# Patient Record
Sex: Female | Born: 1985 | Hispanic: No | Marital: Single | Smoking: Current some day smoker
Health system: Southern US, Community
[De-identification: ages and names within clinical notes are randomized; demographics above are authoritative.]

## PROBLEM LIST (undated history)

## (undated) DIAGNOSIS — F32A Depression, unspecified: Secondary | ICD-10-CM

## (undated) DIAGNOSIS — F419 Anxiety disorder, unspecified: Secondary | ICD-10-CM

## (undated) DIAGNOSIS — T7840XA Allergy, unspecified, initial encounter: Secondary | ICD-10-CM

## (undated) DIAGNOSIS — F329 Major depressive disorder, single episode, unspecified: Secondary | ICD-10-CM

## (undated) HISTORY — DX: Major depressive disorder, single episode, unspecified: F32.9

## (undated) HISTORY — PX: WISDOM TOOTH EXTRACTION: SHX21

## (undated) HISTORY — DX: Anxiety disorder, unspecified: F41.9

## (undated) HISTORY — DX: Depression, unspecified: F32.A

## (undated) HISTORY — DX: Allergy, unspecified, initial encounter: T78.40XA

---

## 2004-08-04 ENCOUNTER — Other Ambulatory Visit: Admission: RE | Admit: 2004-08-04 | Discharge: 2004-08-04 | Payer: Self-pay | Admitting: Family Medicine

## 2005-08-14 ENCOUNTER — Other Ambulatory Visit: Admission: RE | Admit: 2005-08-14 | Discharge: 2005-08-14 | Payer: Self-pay | Admitting: Obstetrics and Gynecology

## 2006-03-19 ENCOUNTER — Ambulatory Visit: Payer: Self-pay | Admitting: Family Medicine

## 2007-07-15 ENCOUNTER — Emergency Department (HOSPITAL_COMMUNITY): Admission: EM | Admit: 2007-07-15 | Discharge: 2007-07-15 | Payer: Self-pay | Admitting: Emergency Medicine

## 2007-10-19 ENCOUNTER — Encounter: Admission: RE | Admit: 2007-10-19 | Discharge: 2007-10-19 | Payer: Self-pay | Admitting: Internal Medicine

## 2007-10-31 ENCOUNTER — Other Ambulatory Visit: Admission: RE | Admit: 2007-10-31 | Discharge: 2007-10-31 | Payer: Self-pay | Admitting: Family Medicine

## 2007-11-01 ENCOUNTER — Ambulatory Visit: Payer: Self-pay | Admitting: Family Medicine

## 2008-01-20 ENCOUNTER — Ambulatory Visit: Payer: Self-pay | Admitting: Family Medicine

## 2008-06-08 ENCOUNTER — Ambulatory Visit: Payer: Self-pay | Admitting: Family Medicine

## 2008-10-18 ENCOUNTER — Other Ambulatory Visit: Admission: RE | Admit: 2008-10-18 | Discharge: 2008-10-18 | Payer: Self-pay | Admitting: Family Medicine

## 2008-10-18 ENCOUNTER — Ambulatory Visit: Payer: Self-pay | Admitting: Family Medicine

## 2008-12-27 ENCOUNTER — Ambulatory Visit: Payer: Self-pay | Admitting: Family Medicine

## 2008-12-31 ENCOUNTER — Ambulatory Visit: Payer: Self-pay | Admitting: Family Medicine
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
## 2009-02-13 ENCOUNTER — Ambulatory Visit: Payer: Self-pay | Admitting: Family Medicine

## 2009-02-18 ENCOUNTER — Ambulatory Visit: Payer: Self-pay | Admitting: Family Medicine

## 2009-09-24 ENCOUNTER — Ambulatory Visit: Payer: Self-pay | Admitting: Family Medicine

## 2009-10-15 ENCOUNTER — Ambulatory Visit: Payer: Self-pay | Admitting: Physician Assistant

## 2014-02-10 ENCOUNTER — Ambulatory Visit (INDEPENDENT_AMBULATORY_CARE_PROVIDER_SITE_OTHER): Payer: BC Managed Care – PPO | Admitting: Emergency Medicine

## 2014-02-10 VITALS — BP 94/60 | HR 71 | Temp 98.9°F | Resp 16 | Ht 66.0 in | Wt 139.1 lb

## 2014-02-10 DIAGNOSIS — J020 Streptococcal pharyngitis: Secondary | ICD-10-CM

## 2014-02-10 MED ORDER — PENICILLIN V POTASSIUM 500 MG PO TABS: 500.0000 mg | ORAL_TABLET | Freq: Four times a day (QID) | ORAL | Status: DC

## 2014-02-10 NOTE — Progress Notes (Signed)
[facility] [address], [place] [address] [phone]  Date:  02/10/2014   Name:  Hannah PoliBianca Memon   DOB:  04/29/1986   MRN:  [id number]  PCP:  No PCP Per Patient    Chief Complaint: Sore Throat and Fever   History of Present Illness:  Hannah PoliBianca Schneck is a 28 y.o. very pleasant female patient who presents with the following:  Ill with sore throat and difficulty swallowing.  No wheezing or shortness of breath.  No nausea or vomiting.  Has cough productive green sputum.  No improvement with over the counter medications or other home remedies. Denies other complaint or health concern today.   There are no active problems to display for this patient.   Past Medical History  Diagnosis Date  . Allergy   . Anxiety   . Depression     Past Surgical History  Procedure Laterality Date  . Wisdom tooth extraction      History  Substance Use Topics  . Smoking status: Current Some Day Smoker  . Smokeless tobacco: Never Used  . Alcohol Use: Yes     Comment: 3 beers/week    Family History  Problem Relation Age of Onset  . Cancer Mother   . Cancer Father   . Cancer Maternal Grandmother   . Hyperlipidemia Maternal Grandmother   . Stroke Maternal Grandmother   . Cancer Maternal Grandfather   . Diabetes Paternal Grandmother     Allergies  Allergen Reactions  . Azithromycin Diarrhea    Medication list has been reviewed and updated.  No current outpatient prescriptions on file prior to visit.   No current facility-administered medications on file prior to visit.    Review of Systems:  As per HPI, otherwise negative.    Physical Examination: Filed Vitals:   02/10/14 1111  BP: 94/60  Pulse: 71  Temp: 98.9 F (37.2 C)  Resp: 16   Filed Vitals:   02/10/14 1111  Height: 5\' 6"  (1.676 m)  Weight: 139 lb 2 oz (63.107 kg)   Body mass index is 22.47 kg/(m^2). Ideal Body Weight: Weight in (lb) to have BMI = 25: 154.6  GEN: WDWN, NAD,  Non-toxic, A & O x 3 HEENT: Atraumatic, Normocephalic. Neck supple. No masses, No LAD. Ears and Nose: No external deformity. CV: RRR, No M/G/R. No JVD. No thrill. No extra heart sounds. PULM: CTA B, no wheezes, crackles, rhonchi. No retractions. No resp. distress. No accessory muscle use. ABD: S, NT, ND, +BS. No rebound. No HSM. EXTR: No c/c/e NEURO Normal gait.  PSYCH: Normally interactive. Conversant. Not depressed or anxious appearing.  Calm demeanor.    Assessment and Plan: Strep Pen v k  Signed,  Phillips OdorJeffery Tatijana Bierly, MD

## 2014-02-10 NOTE — Patient Instructions (Signed)
Strep Throat Strep throat is an infection of the throat caused by a bacteria named Streptococcus pyogenes. Your caregiver may call the infection streptococcal "tonsillitis" or "pharyngitis" depending on whether there are signs of inflammation in the tonsils or back of the throat. Strep throat is most common in children aged 29-15 years during the cold months of the year, but it can occur in people of any age during any season. This infection is spread from person to person (contagious) through coughing, sneezing, or other close contact. SYMPTOMS   Fever or chills.  Painful, swollen, red tonsils or throat.  Pain or difficulty when swallowing.  White or yellow spots on the tonsils or throat.  Swollen, tender lymph nodes or "glands" of the neck or under the jaw.  Red rash all over the body (rare). DIAGNOSIS  Many different infections can cause the same symptoms. A test must be done to confirm the diagnosis so the right treatment can be given. A "rapid strep test" can help your caregiver make the diagnosis in a few minutes. If this test is not available, a light swab of the infected area can be used for a throat culture test. If a throat culture test is done, results are usually available in a day or two. TREATMENT  Strep throat is treated with antibiotic medicine. HOME CARE INSTRUCTIONS   Gargle with 1 tsp of salt in 1 cup of warm water, 3-4 times per day or as needed for comfort.  Family members who also have a sore throat or fever should be tested for strep throat and treated with antibiotics if they have the strep infection.  Make sure everyone in your household washes their hands well.  Do not share food, drinking cups, or personal items that could cause the infection to spread to others.  You may need to eat a soft food diet until your sore throat gets better.  Drink enough water and fluids to keep your urine clear or pale yellow. This will help prevent dehydration.  Get plenty of  rest.  Stay home from school, daycare, or work until you have been on antibiotics for 24 hours.  Only take over-the-counter or prescription medicines for pain, discomfort, or fever as directed by your caregiver.  If antibiotics are prescribed, take them as directed. Finish them even if you start to feel better. SEEK MEDICAL CARE IF:   The glands in your neck continue to enlarge.  You develop a rash, cough, or earache.  You cough up green, yellow-brown, or bloody sputum.  You have pain or discomfort not controlled by medicines.  Your problems seem to be getting worse rather than better. SEEK IMMEDIATE MEDICAL CARE IF:   You develop any new symptoms such as vomiting, severe headache, stiff or painful neck, chest pain, shortness of breath, or trouble swallowing.  You develop severe throat pain, drooling, or changes in your voice.  You develop swelling of the neck, or the skin on the neck becomes red and tender.  You have a fever.  You develop signs of dehydration, such as fatigue, dry mouth, and decreased urination.  You become increasingly sleepy, or you cannot wake up completely. Document Released: 07/10/2000 Document Revised: 06/29/2012 Document Reviewed: 09/11/2010 ExitCare Patient Information 2015 [employer]. This information is not intended to replace advice given to you by your health care provider. Make sure you discuss any questions you have with your health care provider.  

## 2014-05-16 ENCOUNTER — Ambulatory Visit (INDEPENDENT_AMBULATORY_CARE_PROVIDER_SITE_OTHER): Payer: BC Managed Care – PPO | Admitting: Family Medicine

## 2014-05-16 VITALS — BP 116/64 | HR 81 | Temp 98.5°F | Resp 18 | Ht 65.5 in | Wt 141.2 lb

## 2014-05-16 DIAGNOSIS — R590 Localized enlarged lymph nodes: Secondary | ICD-10-CM

## 2014-05-16 DIAGNOSIS — J039 Acute tonsillitis, unspecified: Secondary | ICD-10-CM

## 2014-05-16 DIAGNOSIS — J351 Hypertrophy of tonsils: Secondary | ICD-10-CM

## 2014-05-16 DIAGNOSIS — J342 Deviated nasal septum: Secondary | ICD-10-CM

## 2014-05-16 DIAGNOSIS — J029 Acute pharyngitis, unspecified: Secondary | ICD-10-CM

## 2014-05-16 DIAGNOSIS — H6982 Other specified disorders of Eustachian tube, left ear: Secondary | ICD-10-CM

## 2014-05-16 DIAGNOSIS — H6992 Unspecified Eustachian tube disorder, left ear: Secondary | ICD-10-CM

## 2014-05-16 DIAGNOSIS — R599 Enlarged lymph nodes, unspecified: Secondary | ICD-10-CM

## 2014-05-16 LAB — POCT CBC
Granulocyte percent: 74.6 %G (ref 37–80)
HEMATOCRIT: 42 % (ref 37.7–47.9)
HEMOGLOBIN: 13.7 g/dL (ref 12.2–16.2)
LYMPH, POC: 2.1 (ref 0.6–3.4)
MCH: 28.9 pg (ref 27–31.2)
MCHC: 32.7 g/dL (ref 31.8–35.4)
MCV: 88.3 fL (ref 80–97)
MID (CBC): 0.7 (ref 0–0.9)
MPV: 7.8 fL (ref 0–99.8)
POC GRANULOCYTE: 8.4 — AB (ref 2–6.9)
POC LYMPH %: 18.9 % (ref 10–50)
POC MID %: 6.5 % (ref 0–12)
Platelet Count, POC: 201 10*3/uL (ref 142–424)
RBC: 4.76 M/uL (ref 4.04–5.48)
RDW, POC: 12.6 %
WBC: 11.3 10*3/uL — AB (ref 4.6–10.2)

## 2014-05-16 LAB — POCT RAPID STREP A (OFFICE): Rapid Strep A Screen: NEGATIVE

## 2014-05-16 LAB — POCT SEDIMENTATION RATE: POCT SED RATE: 22 mm/hr (ref 0–22)

## 2014-05-16 MED ORDER — FIRST-DUKES MOUTHWASH MT SUSP: 5.0000 mL | OROMUCOSAL | Status: AC | PRN

## 2014-05-16 MED ORDER — AMOXICILLIN-POT CLAVULANATE 875-125 MG PO TABS: 1.0000 | ORAL_TABLET | Freq: Two times a day (BID) | ORAL | Status: DC

## 2014-05-16 MED ORDER — HYDROCODONE-ACETAMINOPHEN 7.5-325 MG/15ML PO SOLN: 5.0000 mL | ORAL | Status: AC | PRN

## 2014-05-16 NOTE — Patient Instructions (Signed)
Tonsillitis Tonsillitis is an infection of the throat that causes the tonsils to become red, tender, and swollen. Tonsils are collections of lymphoid tissue at the back of the throat. Each tonsil has crevices (crypts). Tonsils help fight nose and throat infections and keep infection from spreading to other parts of the body for the first 18 months of life.  CAUSES Sudden (acute) tonsillitis is usually caused by infection with streptococcal bacteria. Long-lasting (chronic) tonsillitis occurs when the crypts of the tonsils become filled with pieces of food and bacteria, which makes it easy for the tonsils to become repeatedly infected. SYMPTOMS  Symptoms of tonsillitis include:  A sore throat, with possible difficulty swallowing.  White patches on the tonsils.  Fever.  Tiredness.  New episodes of snoring during sleep, when you did not snore before.  Small, foul-smelling, yellowish-white pieces of material (tonsilloliths) that you occasionally cough up or spit out. The tonsilloliths can also cause you to have bad breath. DIAGNOSIS Tonsillitis can be diagnosed through a physical exam. Diagnosis can be confirmed with the results of lab tests, including a throat culture. TREATMENT  The goals of tonsillitis treatment include the reduction of the severity and duration of symptoms and prevention of associated conditions. Symptoms of tonsillitis can be improved with the use of steroids to reduce the swelling. Tonsillitis caused by bacteria can be treated with antibiotic medicines. Usually, treatment with antibiotic medicines is started before the cause of the tonsillitis is known. However, if it is determined that the cause is not bacterial, antibiotic medicines will not treat the tonsillitis. If attacks of tonsillitis are severe and frequent, your health care provider may recommend surgery to remove the tonsils (tonsillectomy). HOME CARE INSTRUCTIONS   Rest as much as possible and get plenty of  sleep.  Drink plenty of fluids. While the throat is very sore, eat soft foods or liquids, such as sherbet, soups, or instant breakfast drinks.  Eat frozen ice pops.  Gargle with a warm or cold liquid to help soothe the throat. Mix 1/4 teaspoon of salt and 1/4 teaspoon of baking soda in 8 oz of water. SEEK MEDICAL CARE IF:   Large, tender lumps develop in your neck.  A rash develops.  A green, yellow-brown, or bloody substance is coughed up.  You are unable to swallow liquids or food for 24 hours.  You notice that only one of the tonsils is swollen. SEEK IMMEDIATE MEDICAL CARE IF:   You develop any new symptoms such as vomiting, severe headache, stiff neck, chest pain, or trouble breathing or swallowing.  You have severe throat pain along with drooling or voice changes.  You have severe pain, unrelieved with recommended medications.  You are unable to fully open the mouth.  You develop redness, swelling, or severe pain anywhere in the neck.  You have a fever. MAKE SURE YOU:   Understand these instructions.  Will watch your condition.  Will get help right away if you are not doing well or get worse. Document Released: 04/22/2005 Document Revised: 11/27/2013 Document Reviewed: 12/30/2012 [facility] Patient Information 2015 [place] [place]. This information is not intended to replace advice given to you by your health care provider. Make sure you discuss any questions you have with your health care provider.  Cervical Adenitis You have a swollen lymph gland in your neck. This commonly happens with Strep and virus infections, dental problems, insect bites, and injuries about the face, scalp, or neck. The lymph glands swell as the body fights the infection or  heals the injury. Swelling and firmness typically lasts for several weeks after the infection or injury is healed. Rarely lymph glands can become swollen because of cancer or TB. Antibiotics are prescribed if there is evidence  of an infection. Sometimes an infected lymph gland becomes filled with pus. This condition may require opening up the abscessed gland by draining it surgically. Most of the time infected glands return to normal within two weeks. Do not poke or squeeze the swollen lymph nodes. That may keep them from shrinking back to their normal size. If the lymph gland is still swollen after 2 weeks, further medical evaluation is needed.  SEEK IMMEDIATE MEDICAL CARE IF:  You have difficulty swallowing or breathing, increased swelling, severe pain, or a high fever.  Document Released: 07/13/2005 Document Revised: 10/05/2011 Document Reviewed: 01/02/2007 [facility] Patient Information 2015 [place], [place]. This information is not intended to replace advice given to you by your health care provider. Make sure you discuss any questions you have with your health care provider.

## 2014-05-16 NOTE — Progress Notes (Signed)
Subjective:    Patient ID: Hannah Gutierrez, female    DOB: 06/13/1986, 28 y.o.   MRN: [id number] Chief Complaint  Patient presents with  . Swollen Glands    In pt's neck. Pt. noticed them yesterday. Pt. states her neck is very tender.   . Facial pressure    HPI  Started feeling exhausted 2 nights ago (working a ton at [employer]) and last night began feeling ill - felt one swollen gland yesterday on right and then last night the left started swelling as well. Having some more trouble swallowing.  Having HA and bilateral sinus and ear pressure.  Has had some chills. Left ear with "problems" - gets sore constantly.  Would like to see ENT about this.   No sig rhinitis or congestion but did have a cough productive of some white and green sputum this a.m.  Has some blood in rhinitis and everything feels dry.  Not sleeping well due to sxs.  Pharyngitis was worst about 3d ago but has gradually improved. Not using any otc cough/cold meds. In public last wkend but no specific sick contacts.  Smoking very very rarely - none recently  Past Medical History  Diagnosis Date  . Allergy   . Anxiety   . Depression    No current outpatient prescriptions on file prior to visit.   No current facility-administered medications on file prior to visit.   Allergies  Allergen Reactions  . Azithromycin Diarrhea     Review of Systems  Constitutional: Positive for chills, appetite change and fatigue. Negative for fever, diaphoresis and activity change.  HENT: Positive for ear pain, nosebleeds, sinus pressure, sore throat and trouble swallowing. Negative for congestion, ear discharge, mouth sores, postnasal drip, rhinorrhea, sneezing and voice change.   Eyes: Negative for pain and itching.  Respiratory: Positive for cough. Negative for shortness of breath.   Cardiovascular: Negative for chest pain.  Gastrointestinal: Negative for nausea, vomiting, abdominal pain, diarrhea and constipation.    Genitourinary: Negative for dysuria.  Musculoskeletal: Positive for myalgias and neck pain. Negative for arthralgias, gait problem, joint swelling and neck stiffness.  Neurological: Positive for headaches. Negative for dizziness and syncope.  Hematological: Positive for adenopathy.  Psychiatric/Behavioral: Positive for sleep disturbance.       Objective:  BP 116/64  Pulse 81  Temp(Src) 98.5 F (36.9 C) (Oral)  Resp 18  Ht 5' 5.5" (1.664 m)  Wt 141 lb 3.2 oz (64.048 kg)  BMI 23.13 kg/m2  SpO2 99%  LMP 05/08/2014  Physical Exam  Constitutional: She is oriented to person, place, and time. She appears well-developed and well-nourished. No distress.  HENT:  Head: Normocephalic and atraumatic.  Right Ear: Tympanic membrane, external ear and ear canal normal.  Left Ear: Tympanic membrane, external ear and ear canal normal.  Nose: Nose normal. No mucosal edema or rhinorrhea.  Mouth/Throat: Uvula is midline and mucous membranes are normal. Mucous membranes are not pale and not dry. No trismus in the jaw. No uvula swelling. Oropharyngeal exudate, posterior oropharyngeal edema and posterior oropharyngeal erythema present. No tonsillar abscesses.  Eyes: Conjunctivae are normal. Right eye exhibits no discharge. Left eye exhibits no discharge. No scleral icterus.  Neck: Normal range of motion. Neck supple.  Cardiovascular: Normal rate, regular rhythm, normal heart sounds and intact distal pulses.   Pulmonary/Chest: Effort normal and breath sounds normal. No respiratory distress.  Lymphadenopathy:       Head (right side): Submandibular and tonsillar adenopathy present. No preauricular, no posterior  auricular and no occipital adenopathy present.       Head (left side): Submandibular and tonsillar adenopathy present. No preauricular, no posterior auricular and no occipital adenopathy present.    She has cervical adenopathy.       Right cervical: Superficial cervical adenopathy present. No  posterior cervical adenopathy present.      Left cervical: No posterior cervical adenopathy present.       Right: No supraclavicular adenopathy present.       Left: No supraclavicular adenopathy present.  Neurological: She is alert and oriented to person, place, and time.  Skin: Skin is warm and dry. She is not diaphoretic. No erythema.  Psychiatric: She has a normal mood and affect. Her behavior is normal.          Assessment & Plan:   Pharyngitis - Plan: POCT CBC, POCT SEDIMENTATION RATE, POCT rapid strep A, Culture, Group A Strep, Epstein-Barr virus VCA antibody panel  Swollen tonsil - Plan: POCT CBC, POCT SEDIMENTATION RATE, POCT rapid strep A, Culture, Group A Strep, Epstein-Barr virus VCA antibody panel  Acute tonsillitis  Eustachian tube dysfunction, left - Plan: Ambulatory referral to ENT  Deviated nasal septum - Plan: Ambulatory referral to ENT  Anterior cervical adenopathy  Meds ordered this encounter  Medications  . amoxicillin-clavulanate (AUGMENTIN) 875-125 MG per tablet    Sig: Take 1 tablet by mouth 2 (two) times daily.    Dispense:  20 tablet    Refill:  0  . Diphenhyd-Hydrocort-Nystatin (FIRST-DUKES MOUTHWASH) SUSP    Sig: Use as directed 5 mLs in the mouth or throat every 2 (two) hours as needed (sore throat).    Dispense:  237 mL    Refill:  0  . HYDROcodone-acetaminophen (HYCET) 7.5-325 mg/15 ml solution    Sig: Take 5-10 mLs by mouth every 4 (four) hours as needed for moderate pain.    Dispense:  140 mL    Refill:  0    Norberto Sorenson, MD MPH  Results for orders placed in visit on 05/16/14  CULTURE, GROUP A STREP      Result Value Ref Range   Organism ID, Bacteria Normal Upper Respiratory Flora     Organism ID, Bacteria No Beta Hemolytic Streptococci Isolated    EPSTEIN-BARR VIRUS VCA ANTIBODY PANEL      Result Value Ref Range   EBV VCA IgG 156.0 (*) <18.0 U/mL   EBV VCA IgM <10.0  <36.0 U/mL   EBV EA IgG <5.0  <9.0 U/mL   EBV NA IgG 34.8 (*)  <18.0 U/mL  POCT CBC      Result Value Ref Range   WBC 11.3 (*) 4.6 - 10.2 K/uL   Lymph, poc 2.1  0.6 - 3.4   POC LYMPH PERCENT 18.9  10 - 50 %L   MID (cbc) 0.7  0 - 0.9   POC MID % 6.5  0 - 12 %M   POC Granulocyte 8.4 (*) 2 - 6.9   Granulocyte percent 74.6  37 - 80 %G   RBC 4.76  4.04 - 5.48 M/uL   Hemoglobin 13.7  12.2 - 16.2 g/dL   HCT, POC 40.9  81.1 - 47.9 %   MCV 88.3  80 - 97 fL   MCH, POC 28.9  27 - 31.2 pg   MCHC 32.7  31.8 - 35.4 g/dL   RDW, POC 91.4     Platelet Count, POC 201  142 - 424 K/uL   MPV 7.8  0 -  99.8 fL  POCT SEDIMENTATION RATE      Result Value Ref Range   POCT SED RATE 22  0 - 22 mm/hr  POCT RAPID STREP A (OFFICE)      Result Value Ref Range   Rapid Strep A Screen Negative  Negative

## 2014-05-17 ENCOUNTER — Telehealth: Payer: Self-pay

## 2014-05-17 LAB — EPSTEIN-BARR VIRUS VCA ANTIBODY PANEL
EBV NA IGG: 34.8 U/mL — AB (ref <18.0)
EBV VCA IgG: 156 U/mL — ABNORMAL HIGH (ref <18.0)

## 2014-05-17 NOTE — Telephone Encounter (Signed)
Pt LM on lab VM. Advised that all labs were not back yet but that the EBV was. Can you review that for me please so I can let pt know. Thanks

## 2014-05-18 ENCOUNTER — Telehealth: Payer: Self-pay

## 2014-05-18 LAB — CULTURE, GROUP A STREP: ORGANISM ID, BACTERIA: NORMAL

## 2014-05-18 NOTE — Telephone Encounter (Signed)
Calling is calling back in regards to getting he questions answered about lab results. Please call!

## 2014-05-18 NOTE — Telephone Encounter (Signed)
Labs sent to MyChart w/ below note  Hannah Gutierrez -    Only normal bacteria grew from your throat.  Your blood did not show any signs of inflammation and infection and it appears that you had mono at some prior time but not now!  Good news!  Hope you are feeling better soon and if your swollen glands are getting worse or still present in 2 wks make sure you come back. Dr. Clelia CroftShaw

## 2014-05-19 NOTE — Telephone Encounter (Signed)
Patient is calling again regarding labs. Please return call and advise.

## 2014-05-20 NOTE — Telephone Encounter (Signed)
Pt states that her tonisils are still very swollen and that they are getting worse and no better.  Advised pt to come back in to be further evaluated

## 2016-07-29 DIAGNOSIS — F411 Generalized anxiety disorder: Secondary | ICD-10-CM | POA: Diagnosis not present

## 2016-08-17 DIAGNOSIS — F411 Generalized anxiety disorder: Secondary | ICD-10-CM | POA: Diagnosis not present

## 2016-09-08 DIAGNOSIS — F411 Generalized anxiety disorder: Secondary | ICD-10-CM | POA: Diagnosis not present

## 2016-09-14 DIAGNOSIS — F411 Generalized anxiety disorder: Secondary | ICD-10-CM | POA: Diagnosis not present

## 2016-09-21 DIAGNOSIS — F411 Generalized anxiety disorder: Secondary | ICD-10-CM | POA: Diagnosis not present

## 2016-09-28 DIAGNOSIS — F411 Generalized anxiety disorder: Secondary | ICD-10-CM | POA: Diagnosis not present

## 2016-11-12 DIAGNOSIS — F411 Generalized anxiety disorder: Secondary | ICD-10-CM | POA: Diagnosis not present

## 2016-12-07 DIAGNOSIS — F411 Generalized anxiety disorder: Secondary | ICD-10-CM | POA: Diagnosis not present

## 2017-09-30 DIAGNOSIS — M9901 Segmental and somatic dysfunction of cervical region: Secondary | ICD-10-CM | POA: Diagnosis not present

## 2017-09-30 DIAGNOSIS — M9902 Segmental and somatic dysfunction of thoracic region: Secondary | ICD-10-CM | POA: Diagnosis not present

## 2017-09-30 DIAGNOSIS — M62830 Muscle spasm of back: Secondary | ICD-10-CM | POA: Diagnosis not present

## 2017-09-30 DIAGNOSIS — M9903 Segmental and somatic dysfunction of lumbar region: Secondary | ICD-10-CM | POA: Diagnosis not present

## 2017-10-12 DIAGNOSIS — Z113 Encounter for screening for infections with a predominantly sexual mode of transmission: Secondary | ICD-10-CM | POA: Diagnosis not present

## 2017-10-12 DIAGNOSIS — N898 Other specified noninflammatory disorders of vagina: Secondary | ICD-10-CM | POA: Diagnosis not present

## 2017-10-12 DIAGNOSIS — Z124 Encounter for screening for malignant neoplasm of cervix: Secondary | ICD-10-CM | POA: Diagnosis not present

## 2017-10-12 DIAGNOSIS — Z01419 Encounter for gynecological examination (general) (routine) without abnormal findings: Secondary | ICD-10-CM | POA: Diagnosis not present

## 2017-10-12 DIAGNOSIS — Z6821 Body mass index (BMI) 21.0-21.9, adult: Secondary | ICD-10-CM | POA: Diagnosis not present

## 2017-10-12 DIAGNOSIS — Z1389 Encounter for screening for other disorder: Secondary | ICD-10-CM | POA: Diagnosis not present

## 2017-10-13 DIAGNOSIS — Z124 Encounter for screening for malignant neoplasm of cervix: Secondary | ICD-10-CM | POA: Diagnosis not present

## 2017-10-13 DIAGNOSIS — Z1151 Encounter for screening for human papillomavirus (HPV): Secondary | ICD-10-CM | POA: Diagnosis not present

## 2018-06-07 DIAGNOSIS — M542 Cervicalgia: Secondary | ICD-10-CM | POA: Diagnosis not present

## 2018-06-07 DIAGNOSIS — M25512 Pain in left shoulder: Secondary | ICD-10-CM | POA: Diagnosis not present

## 2018-06-07 DIAGNOSIS — M25511 Pain in right shoulder: Secondary | ICD-10-CM | POA: Diagnosis not present

## 2018-06-17 DIAGNOSIS — S161XXA Strain of muscle, fascia and tendon at neck level, initial encounter: Secondary | ICD-10-CM | POA: Diagnosis not present

## 2018-06-19 ENCOUNTER — Encounter (HOSPITAL_COMMUNITY): Payer: Self-pay | Admitting: Emergency Medicine

## 2018-06-19 ENCOUNTER — Ambulatory Visit (HOSPITAL_COMMUNITY)
Admission: EM | Admit: 2018-06-19 | Discharge: 2018-06-19 | Disposition: A | Discharge status: Home / Self Care | Payer: BLUE CROSS/BLUE SHIELD | Attending: Family Medicine | Admitting: Family Medicine

## 2018-06-19 DIAGNOSIS — F17200 Nicotine dependence, unspecified, uncomplicated: Secondary | ICD-10-CM | POA: Insufficient documentation

## 2018-06-19 DIAGNOSIS — Z885 Allergy status to narcotic agent status: Secondary | ICD-10-CM | POA: Diagnosis not present

## 2018-06-19 DIAGNOSIS — Z886 Allergy status to analgesic agent status: Secondary | ICD-10-CM | POA: Insufficient documentation

## 2018-06-19 DIAGNOSIS — R509 Fever, unspecified: Secondary | ICD-10-CM | POA: Diagnosis not present

## 2018-06-19 DIAGNOSIS — J029 Acute pharyngitis, unspecified: Secondary | ICD-10-CM | POA: Diagnosis not present

## 2018-06-19 DIAGNOSIS — Z881 Allergy status to other antibiotic agents status: Secondary | ICD-10-CM | POA: Insufficient documentation

## 2018-06-19 LAB — POCT RAPID STREP A: STREPTOCOCCUS, GROUP A SCREEN (DIRECT): NEGATIVE

## 2018-06-19 MED ORDER — AMOXICILLIN 500 MG PO CAPS: 500.0000 mg | ORAL_CAPSULE | Freq: Two times a day (BID) | ORAL | 0 refills | Status: AC

## 2018-06-19 NOTE — Discharge Instructions (Addendum)
Strep was negative, but based on physical exam I am going to treat you today for potential strep throat.  We will still send out rapid strep to culture Get plenty of rest and push fluids Amoxicillin prescribed.  Take as directed and to completion Use OTC medications like ibuprofen or tylenol as needed fever or pain Follow up with PCP or [facility] if symptoms persist Return or go to ER if you have any new or worsening symptoms fever, chills, nausea, vomiting, chest pain, cough, shortness of breath, wheezing, abdominal pain, changes in bowel or bladder habits, etc...Marland Kitchen

## 2018-06-19 NOTE — ED Provider Notes (Signed)
[facility]   [id number] 06/19/18 Arrival Time: 1702   CC: URI symptoms   SUBJECTIVE: History from: patient.  Hannah Gutierrez is a 32 y.o. female who presents with abrupt onset of sinus pressure, sore throat and fever with tmax of 101 that began 4 days ago.  Denies positive sick exposure or precipitating event.  Was seen by PCP on Friday and did not have a rapid strep test.  Has tried OTC medications without relief.  Symptoms are made worse with swallowing, but tolerating own secretions and liquids without difficulty.  Reports previous symptoms in the past.   Denies rhinorrhea, SOB, wheezing, chest pain, nausea, changes in bowel or bladder habits.    Received flu shot this year: no.  ROS: As per HPI.  Past Medical History:  Diagnosis Date  . Allergy   . Anxiety   . Depression    Past Surgical History:  Procedure Laterality Date  . WISDOM TOOTH EXTRACTION     Allergies  Allergen Reactions  . Azithromycin Diarrhea   No current facility-administered medications on file prior to encounter.    Current Outpatient Medications on File Prior to Encounter  Medication Sig Dispense Refill  . acetaminophen (TYLENOL) 325 MG tablet Take 650 mg by mouth every 6 (six) hours as needed for fever.    . Diphenhyd-Hydrocort-Nystatin (FIRST-DUKES MOUTHWASH) SUSP Use as directed 5 mLs in the mouth or throat every 2 (two) hours as needed (sore throat). 237 mL 0  . HYDROcodone-acetaminophen (HYCET) 7.5-325 mg/15 ml solution Take 5-10 mLs by mouth every 4 (four) hours as needed for moderate pain. 140 mL 0   Social History   Socioeconomic History  . Marital status: Single    Spouse name: Not on file  . Number of children: Not on file  . Years of education: Not on file  . Highest education level: Not on file  Occupational History  . Not on file  Social Needs  . Financial resource strain: Not on file  . Food insecurity:    Worry: Not on file    Inability: Not on file  .  Transportation needs:    Medical: Not on file    Non-medical: Not on file  Tobacco Use  . Smoking status: Current Some Day Smoker  . Smokeless tobacco: Never Used  Substance and Sexual Activity  . Alcohol use: Yes    Comment: 3 beers/week  . Drug use: No  . Sexual activity: Not on file  Lifestyle  . Physical activity:    Days per week: Not on file    Minutes per session: Not on file  . Stress: Not on file  Relationships  . Social connections:    Talks on phone: Not on file    Gets together: Not on file    Attends religious service: Not on file    Active member of club or organization: Not on file    Attends meetings of clubs or organizations: Not on file    Relationship status: Not on file  . Intimate partner violence:    Fear of current or ex partner: Not on file    Emotionally abused: Not on file    Physically abused: Not on file    Forced sexual activity: Not on file  Other Topics Concern  . Not on file  Social History Narrative  . Not on file   Family History  Problem Relation Age of Onset  . Cancer Mother   . Cancer Father   . Cancer  Maternal Grandmother   . Hyperlipidemia Maternal Grandmother   . Stroke Maternal Grandmother   . Cancer Maternal Grandfather   . Diabetes Paternal Grandmother     OBJECTIVE:  Vitals:   06/19/18 1737  BP: 118/77  Pulse: 74  Temp: 99.2 F (37.3 C)  TempSrc: Oral  SpO2: 100%     General appearance: alert; appears fatigued, but nontoxic; speaking in full sentences and tolerating own secretions HEENT: NCAT; Ears: EACs clear, TMs pearly gray; Eyes: PERRL.  EOM grossly intact. Nose: nares patent without rhinorrhea, Throat: oropharynx clear, tonsils 2+ and erythematous with white exudates, uvula midline  Neck: supple without LAD Lungs: unlabored respirations, symmetrical air entry; cough: absent; no respiratory distress; CTAB Heart: regular rate and rhythm.  Radial pulses 2+ symmetrical bilaterally Skin: warm and  dry Psychological: alert and cooperative; normal mood and affect  LABS:  Results for orders placed or performed during the hospital encounter of 06/19/18 (from the past 24 hour(s))  POCT rapid strep [facility]     Status: None   Collection Time: 06/19/18  6:30 PM  Result Value Ref Range   Streptococcus, Group A Screen (Direct) NEGATIVE NEGATIVE    ASSESSMENT & PLAN:  1. Acute pharyngitis, unspecified etiology     Meds ordered this encounter  Medications  . amoxicillin (AMOXIL) 500 MG capsule    Sig: Take 1 capsule (500 mg total) by mouth 2 (two) times daily for 10 days.    Dispense:  20 capsule    Refill:  0    Order Specific Question:   Supervising Provider    Answer:   Isa RankinMURRAY, LAURA WILSON [[id number]]   Strep was negative, but based on physical exam I am going to treat you today for potential strep throat.  We will still send out rapid strep to culture Get plenty of rest and push fluids Amoxicillin prescribed.  Take as directed and to completion Use OTC medications like ibuprofen or tylenol as needed fever or pain Follow up with PCP or [facility] if symptoms persist Return or go to ER if you have any new or worsening symptoms fever, chills, nausea, vomiting, chest pain, cough, shortness of breath, wheezing, abdominal pain, changes in bowel or bladder habits, etc...  Reviewed expectations re: course of current medical issues. Questions answered. Outlined signs and symptoms indicating need for more acute intervention. Patient verbalized understanding. After Visit Summary given.         Rennis HardingWurst, Uriah Philipson, PA-C 06/19/18 [id number]

## 2018-06-19 NOTE — ED Triage Notes (Signed)
Pt reports nasal congestion, cough, sore throat, and fever (101.3 today) since Thursday.  Pt saw her MD on Friday, but he did not do a throat swab.

## 2018-06-22 LAB — CULTURE, GROUP A STREP (THRC)

## 2019-06-08 DIAGNOSIS — Z1151 Encounter for screening for human papillomavirus (HPV): Secondary | ICD-10-CM | POA: Diagnosis not present

## 2019-06-08 DIAGNOSIS — Z01419 Encounter for gynecological examination (general) (routine) without abnormal findings: Secondary | ICD-10-CM | POA: Diagnosis not present

## 2019-06-08 DIAGNOSIS — Z1331 Encounter for screening for depression: Secondary | ICD-10-CM | POA: Diagnosis not present

## 2019-06-20 ENCOUNTER — Other Ambulatory Visit: Payer: Self-pay

## 2019-06-20 DIAGNOSIS — Z20822 Contact with and (suspected) exposure to covid-19: Secondary | ICD-10-CM

## 2019-06-22 LAB — NOVEL CORONAVIRUS, NAA: SARS-CoV-2, NAA: NOT DETECTED

## 2019-08-18 ENCOUNTER — Encounter: Payer: Self-pay | Admitting: Psychiatry

## 2019-08-18 ENCOUNTER — Ambulatory Visit (INDEPENDENT_AMBULATORY_CARE_PROVIDER_SITE_OTHER): Payer: BC Managed Care – PPO | Admitting: Psychiatry

## 2019-08-18 DIAGNOSIS — F4322 Adjustment disorder with anxiety: Secondary | ICD-10-CM

## 2019-08-18 NOTE — Progress Notes (Signed)
[facility] Counselor/Therapist Progress Note  Patient ID: Hannah Gutierrez, MRN: [id number],    Date: 08/18/2019  Time Spent: 50 minutes   Treatment Type: Individual Therapy  Reported Symptoms: anxiety  Mental Status Exam:  Appearance:   Casual     Behavior:  Appropriate  Motor:  Normal  Speech/Language:   Clear and Coherent  Affect:  Appropriate  Mood:  anxious  Thought process:  normal  Thought content:    WNL  Sensory/Perceptual disturbances:    WNL  Orientation:  oriented to person, place, time/date and situation  Attention:  Good  Concentration:  Good  Memory:  WNL  Fund of knowledge:   Good  Insight:    Good  Judgment:   Good  Impulse Control:  Good   Risk Assessment: Danger to Self:  No Self-injurious Behavior: No Danger to Others: No Duty to Warn:no Physical Aggression / Violence:No  Access to Firearms a concern: No  Gang Involvement:No   Subjective: I connected with this patient by an approved telecommunication method (video), with her informed consent, and verifying identity and patient privacy.  I was located at my office and patient at her home.  As needed, we discussed the limitations, risks, and security and privacy concerns associated with telehealth service, including the availability and conditions which currently govern in-person appointments and the possibility that 3rd-party payment Tayva Easterday not be fully guaranteed and she Sereen Schaff be responsible for charges.  After she indicated understanding, we proceeded with the session.  Also discussed treatment planning, as needed, including ongoing verbal agreement with the plan, the opportunity to ask and answer all questions, her demonstrated understanding of instructions, and her readiness to call the office should symptoms worsen or she feels she is in a crisis state and needs more immediate and tangible assistance.  The client states that she has been in a new relationship with a woman for the last 2 years.  She  recently moved to [place] and they moved in together.  The girlfriend is also an Training and development officer like the client.  She designs prosthetic arms and legs for people.  The client is currently in school working on her masters to become a Dietitian.  She recently applied to grad schools and is waiting to hear back.  She is currently looking for flexible work. Today the client states her main concerns are her fear of becoming a licensed foster parent and her tendency to overthink things resulting in a catastrophic thought process.  Today we used eye-movement to focus on the client's fear of being a licensed foster parent.  Her negative cognition is, "I am uncertain."  She feels anxiety in her stomach.  Her subjective units of distress is a 5.  As the client processed her thoughts of working with foster kids became overwhelming.  She has a fear of being replaced by the children with her girlfriend.  As she thinks about caring for her child she feels exhaustion.  The girlfriend works full-time and the majority of parenting would fall on the client.  She is not sure if she is up for that.  As the client continued to discuss this I asked her what other things that she does that replenish and refresh her?  As we dialogued about this I pointed out to the client that she has a great need for a social network.  She also needs to engage her hobbies such as art and cooking.  She has a Marine scientist for exploring outdoors and  loves to be outside.  I explained that all of these were great things for her to pursue along with some physical activity that will help reduce her overall anxiety.  The client agrees and will work on this.  Her positive cognition at the end of the session was, "I will be okay."  Her subjective units of distress was less than 1.  Interventions: Assertiveness/Communication, Motivational Interviewing, Solution-Oriented/Positive Psychology, [employer] Desensitization and Reprocessing (EMDR) and  Insight-Oriented  Diagnosis:   ICD-10-CM   1. 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  Gelene Mink Gaelen Brager, [facility]

## 2019-08-24 ENCOUNTER — Encounter: Payer: Self-pay | Admitting: Psychiatry

## 2019-08-24 ENCOUNTER — Ambulatory Visit (INDEPENDENT_AMBULATORY_CARE_PROVIDER_SITE_OTHER): Payer: BC Managed Care – PPO | Admitting: Psychiatry

## 2019-08-24 DIAGNOSIS — F4322 Adjustment disorder with anxiety: Secondary | ICD-10-CM | POA: Diagnosis not present

## 2019-08-24 NOTE — Progress Notes (Signed)
[facility] Counselor/Therapist Progress Note  Patient ID: Hannah Gutierrez, MRN: [id number],    Date: 08/24/2019   Time Spent: 50 minutes   Treatment Type: Individual Therapy  Reported Symptoms: anxiety  Mental Status Exam:  Appearance:   Casual     Behavior:  Appropriate  Motor:  Normal  Speech/Language:   Clear and Coherent  Affect:  Appropriate  Mood:  anxious  Thought process:  normal  Thought content:    WNL  Sensory/Perceptual disturbances:    WNL  Orientation:  oriented to person, place, time/date and situation  Attention:  Good  Concentration:  Good  Memory:  WNL  Fund of knowledge:   Good  Insight:    Good  Judgment:   Good  Impulse Control:  Good   Risk Assessment: Danger to Self:  No Self-injurious Behavior: No Danger to Others: No Duty to Warn:no Physical Aggression / Violence:No  Access to Firearms a concern: No  Gang Involvement:No   Subjective: I connected with this patient by an approved telecommunication method (video), with her informed consent, and verifying identity and patient privacy.  I was located at my office and patient at her home.  As needed, we discussed the limitations, risks, and security and privacy concerns associated with telehealth service, including the availability and conditions which currently govern in-person appointments and the possibility that 3rd-party payment Hannah Gutierrez not be fully guaranteed and she Hannah Gutierrez be responsible for charges.  After she indicated understanding, we proceeded with the session.  Also discussed treatment planning, as needed, including ongoing verbal agreement with the plan, the opportunity to ask and answer all questions, her demonstrated understanding of instructions, and her readiness to call the office should symptoms worsen or she feels she is in a crisis state and needs more immediate and tangible assistance. The client states that she has had a good cathartic discussion with her girlfriend.  She discussed at  length about her fears of foster parenting.  She found out that her girlfriend has some of the same fears.  This was a relief for the client.  "Fear controls me."  Since nothing is going on with the client right now, no art and no community she is feeling lost. Today we discussed what is the difference between her instincts and her fears?  We discussed being able to do some work at expressing her feelings.  She states when she gets angry she likes to yell which shuts her girlfriend down.  "I come from a yelling family."  I suggested that the client that she take a wet washcloth and throw it is hard if she can into the bathtub as a way of expressing some of that anger.  She could also yell into a pillow.  The client thought these both could work.  We also discussed using supplements to help manage her anxiety and mood.  I suggested things such as l-theanine, and Inositol and high doses of omega-3 fatty acids.  The client will review these.  We also discussed using reflective listening in her conversations with her girlfriend.  I went through the process of how that works.  I also suggested to the client that she journal her thoughts or at least use a voice to text app to get them down on paper so she could identify the thinking errors.  The client will try this.  Interventions: Assertiveness/Communication, Mindfulness Meditation, Motivational Interviewing, Solution-Oriented/Positive Psychology and Insight-Oriented  Diagnosis:   ICD-10-CM   1. Adjustment disorder with  anxiety  F43.22     Plan: Positive self talk, self-care, exercise, supplements, journaling, thinking errors, reflective listening.  Hannah Gutierrez, [facility]

## 2019-08-31 ENCOUNTER — Ambulatory Visit: Payer: BC Managed Care – PPO | Admitting: Psychiatry

## 2019-09-04 ENCOUNTER — Ambulatory Visit: Payer: BC Managed Care – PPO | Admitting: Psychiatry

## 2019-09-04 DIAGNOSIS — Z23 Encounter for immunization: Secondary | ICD-10-CM | POA: Diagnosis not present

## 2019-09-15 DIAGNOSIS — F4322 Adjustment disorder with anxiety: Secondary | ICD-10-CM | POA: Diagnosis not present

## 2019-09-26 DIAGNOSIS — F4322 Adjustment disorder with anxiety: Secondary | ICD-10-CM | POA: Diagnosis not present

## 2019-10-02 DIAGNOSIS — Z23 Encounter for immunization: Secondary | ICD-10-CM | POA: Diagnosis not present

## 2019-10-19 DIAGNOSIS — F4322 Adjustment disorder with anxiety: Secondary | ICD-10-CM | POA: Diagnosis not present

## 2019-11-21 DIAGNOSIS — Z31430 Encounter of female for testing for genetic disease carrier status for procreative management: Secondary | ICD-10-CM | POA: Diagnosis not present

## 2019-12-08 DIAGNOSIS — Q798 Other congenital malformations of musculoskeletal system: Secondary | ICD-10-CM | POA: Diagnosis not present

## 2019-12-08 DIAGNOSIS — M26621 Arthralgia of right temporomandibular joint: Secondary | ICD-10-CM | POA: Diagnosis not present

## 2019-12-08 DIAGNOSIS — Z1281 Encounter for screening for malignant neoplasm of oral cavity: Secondary | ICD-10-CM | POA: Diagnosis not present

## 2019-12-18 DIAGNOSIS — Z0000 Encounter for general adult medical examination without abnormal findings: Secondary | ICD-10-CM | POA: Diagnosis not present

## 2019-12-18 DIAGNOSIS — Z0184 Encounter for antibody response examination: Secondary | ICD-10-CM | POA: Diagnosis not present

## 2019-12-18 DIAGNOSIS — Z23 Encounter for immunization: Secondary | ICD-10-CM | POA: Diagnosis not present

## 2019-12-18 DIAGNOSIS — Z111 Encounter for screening for respiratory tuberculosis: Secondary | ICD-10-CM | POA: Diagnosis not present

## 2022-03-09 ENCOUNTER — Other Ambulatory Visit: Payer: Self-pay | Admitting: Gastroenterology

## 2022-03-09 DIAGNOSIS — R198 Other specified symptoms and signs involving the digestive system and abdomen: Secondary | ICD-10-CM

## 2022-03-09 DIAGNOSIS — R1032 Left lower quadrant pain: Secondary | ICD-10-CM

## 2022-03-09 DIAGNOSIS — M545 Low back pain, unspecified: Secondary | ICD-10-CM

## 2022-04-01 ENCOUNTER — Other Ambulatory Visit: Payer: BLUE CROSS/BLUE SHIELD

## 2022-04-20 ENCOUNTER — Other Ambulatory Visit: Payer: BLUE CROSS/BLUE SHIELD
